# Patient Record
Sex: Male | Born: 1935 | Race: Black or African American | Hispanic: No | Marital: Married | Smoking: Current every day smoker
Health system: Southern US, Community
[De-identification: ages and names within clinical notes are randomized; demographics above are authoritative.]

## PROBLEM LIST (undated history)

## (undated) DIAGNOSIS — C801 Malignant (primary) neoplasm, unspecified: Secondary | ICD-10-CM

## (undated) HISTORY — PX: LUNG REMOVAL, PARTIAL: SHX233

## (undated) HISTORY — DX: Malignant (primary) neoplasm, unspecified: C80.1

---

## 2007-05-29 ENCOUNTER — Ambulatory Visit: Payer: Self-pay | Admitting: Gastroenterology

## 2007-11-10 ENCOUNTER — Ambulatory Visit: Payer: Self-pay | Admitting: Family Medicine

## 2008-04-04 ENCOUNTER — Ambulatory Visit: Payer: Self-pay | Admitting: Urology

## 2009-05-04 ENCOUNTER — Ambulatory Visit: Payer: Self-pay | Admitting: Family Medicine

## 2010-11-29 ENCOUNTER — Ambulatory Visit: Payer: Self-pay | Admitting: Gastroenterology

## 2012-02-05 ENCOUNTER — Ambulatory Visit: Payer: Self-pay | Admitting: Family Medicine

## 2012-02-11 ENCOUNTER — Ambulatory Visit: Payer: Self-pay | Admitting: Family Medicine

## 2012-02-11 ENCOUNTER — Ambulatory Visit: Payer: Self-pay | Admitting: Oncology

## 2012-02-11 LAB — CREATININE, SERUM
EGFR (African American): >60
EGFR (Non-African Amer.): 55 — ABNORMAL LOW

## 2012-02-12 ENCOUNTER — Ambulatory Visit: Payer: Self-pay | Admitting: Oncology

## 2012-02-12 LAB — PROTIME-INR
INR: 0.9
Prothrombin Time: 12.5 secs (ref 11.5–14.7)

## 2012-02-12 LAB — CBC CANCER CENTER
Basophil #: 0.1 x10 3/mm (ref 0.0–0.1)
Eosinophil #: 0.3 x10 3/mm (ref 0.0–0.7)
Eosinophil %: 2.9 %
HCT: 52.8 % — ABNORMAL HIGH (ref 40.0–52.0)
HGB: 17.4 g/dL (ref 13.0–18.0)
Lymphocyte #: 2.6 x10 3/mm (ref 1.0–3.6)
Lymphocyte %: 25.3 %
MCHC: 33 g/dL (ref 32.0–36.0)
MCV: 92 fL (ref 80–100)
Monocyte #: 1 x10 3/mm (ref 0.2–1.0)
Monocyte %: 9.9 %
Platelet: 259 x10 3/mm (ref 150–440)
RBC: 5.77 10*6/uL (ref 4.40–5.90)
RDW: 13.7 % (ref 11.5–14.5)
WBC: 10.2 x10 3/mm (ref 3.8–10.6)

## 2012-02-12 LAB — COMPREHENSIVE METABOLIC PANEL
Albumin: 4 g/dL (ref 3.4–5.0)
Anion Gap: 3 — ABNORMAL LOW (ref 7–16)
BUN: 9 mg/dL (ref 7–18)
Bilirubin,Total: 0.6 mg/dL (ref 0.2–1.0)
Chloride: 103 mmol/L (ref 98–107)
Co2: 30 mmol/L (ref 21–32)
Creatinine: 0.98 mg/dL (ref 0.60–1.30)
EGFR (African American): >60
Glucose: 83 mg/dL (ref 65–99)
SGOT(AST): 33 U/L (ref 15–37)
SGPT (ALT): 20 U/L (ref 12–78)
Total Protein: 7.9 g/dL (ref 6.4–8.2)

## 2012-02-12 LAB — APTT: Activated PTT: 34.3 secs (ref 23.6–35.9)

## 2012-02-16 ENCOUNTER — Ambulatory Visit: Payer: Self-pay | Admitting: Oncology

## 2012-02-18 ENCOUNTER — Ambulatory Visit: Payer: Self-pay | Admitting: Oncology

## 2012-02-19 ENCOUNTER — Ambulatory Visit: Payer: Self-pay | Admitting: Oncology

## 2012-02-25 ENCOUNTER — Ambulatory Visit: Payer: Self-pay | Admitting: Oncology

## 2012-03-18 ENCOUNTER — Ambulatory Visit: Payer: Self-pay | Admitting: Oncology

## 2012-03-19 ENCOUNTER — Other Ambulatory Visit: Payer: Self-pay | Admitting: Cardiothoracic Surgery

## 2012-03-19 ENCOUNTER — Ambulatory Visit: Payer: Self-pay

## 2012-03-19 LAB — PROTIME-INR
INR: 0.8
Prothrombin Time: 11.4 secs — ABNORMAL LOW (ref 11.5–14.7)

## 2012-03-19 LAB — COMPREHENSIVE METABOLIC PANEL
Albumin: 4 g/dL (ref 3.4–5.0)
Alkaline Phosphatase: 114 U/L (ref 50–136)
BUN: 8 mg/dL (ref 7–18)
Bilirubin,Total: 0.6 mg/dL (ref 0.2–1.0)
Creatinine: 1.14 mg/dL (ref 0.60–1.30)
Glucose: 87 mg/dL (ref 65–99)
SGOT(AST): 33 U/L (ref 15–37)
SGPT (ALT): 19 U/L (ref 12–78)

## 2012-03-19 LAB — CBC WITH DIFFERENTIAL/PLATELET
Basophil #: 0.1 10*3/uL (ref 0.0–0.1)
Eosinophil #: 0.2 10*3/uL (ref 0.0–0.7)
Eosinophil %: 2.1 %
HCT: 49.1 % (ref 40.0–52.0)
HGB: 16 g/dL (ref 13.0–18.0)
Lymphocyte #: 0.9 10*3/uL — ABNORMAL LOW (ref 1.0–3.6)
Lymphocyte %: 9.6 %
MCHC: 32.5 g/dL (ref 32.0–36.0)
Monocyte #: 0.9 x10 3/mm (ref 0.2–1.0)
Monocyte %: 9.8 %
Neutrophil #: 7.2 10*3/uL — ABNORMAL HIGH (ref 1.4–6.5)
RDW: 13.5 % (ref 11.5–14.5)
WBC: 9.3 10*3/uL (ref 3.8–10.6)

## 2012-03-19 LAB — APTT: Activated PTT: 34.3 secs (ref 23.6–35.9)

## 2012-04-23 DIAGNOSIS — G8912 Acute post-thoracotomy pain: Secondary | ICD-10-CM | POA: Insufficient documentation

## 2013-11-29 IMAGING — CT NM PET TUM IMG INITIAL (PI) SKULL BASE T - THIGH
1 of 5 series · 1 of 25 positions shown · non-contrast
Comparison: none

REASON FOR EXAM: Lung cancer initial staging
COMMENTS:

[Series 3: ct wb 3.0 b30f · axial · 3.0mm · 0.98mm/px · 1 of 435 slices shown]
[im 435/435  brain]
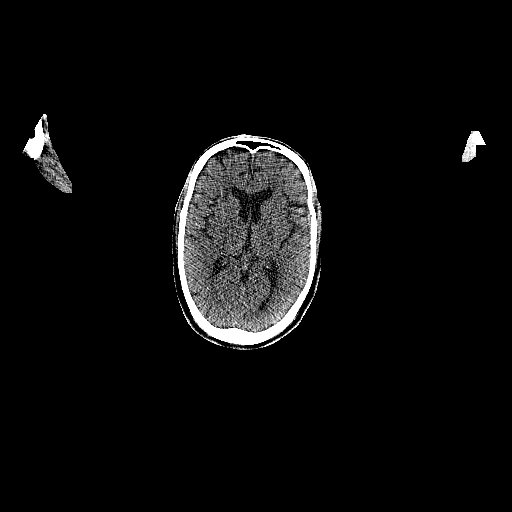

[1 of 25 positions shown; findings below may reference images not displayed]

PROCEDURE:     PET - PET/CT INIT STAGING LUNG CA  - February 25, 2012  [DATE]

RESULT:

The patient has a fasting blood glucose level measuring 78 mg/dl. The
patient received a dose of 12.7 mCi of F18-FDG in the right antecubital
region at [DATE] hours with imaging obtained from the base of the brain into
the thighs between the hours of [DATE] and [DATE]. Low dose noncontrast CT is
performed over the same region for the purposes of attenuation correction
and fusion. The noncontrast CT, attenuation corrected PET images and fused
PET/CT data sets are reconstructed by the [employer] software in the axial,
coronal and sagittal planes. A rotating 3 dimensional maximum intensity
pixel projection study is also performed.

There is no previous similar study for comparison.

There is abnormal accumulation of F18-FDG in the small, irregular density in
the posterior left upper lobe near the level of the aortic arch
demonstrating a maximum SUV of 2.07 and a mean of 1.17. The uptake in the
right lung apical region shows a maximum 4.55 SUV with a mean uptake of
in the area of most intense activity. Less intense uptake is seen inferior
to this. There does not appear to be significant mediastinal or hilar
localization. There is some increased localization about the lateral aspect
of the right hip joint likely secondary to inflammation. Genitourinary and
gastrointestinal uptake appears to be grossly normal.
IMPRESSION: 1.     Findings concerning for underlying malignant disease in the right
upper lobe and possibly in the left upper lobe. No definite abnormal
localization in the mediastinal or hilar regions.
2.     Probable degenerative uptake in the right hip

[REDACTED]

## 2015-07-27 ENCOUNTER — Ambulatory Visit (INDEPENDENT_AMBULATORY_CARE_PROVIDER_SITE_OTHER): Payer: Medicare PPO | Admitting: Family Medicine

## 2015-07-27 ENCOUNTER — Encounter: Payer: Self-pay | Admitting: Family Medicine

## 2015-07-27 VITALS — BP 120/70 | HR 84 | Ht 66.0 in | Wt 128.0 lb

## 2015-07-27 DIAGNOSIS — J0100 Acute maxillary sinusitis, unspecified: Secondary | ICD-10-CM

## 2015-07-27 DIAGNOSIS — J40 Bronchitis, not specified as acute or chronic: Secondary | ICD-10-CM

## 2015-07-27 MED ORDER — AMOXICILLIN-POT CLAVULANATE 875-125 MG PO TABS: 1.0000 | ORAL_TABLET | Freq: Two times a day (BID) | ORAL | Status: DC

## 2015-07-27 NOTE — Progress Notes (Signed)
Name: Jake Ellis   MRN: [id number]    DOB: December 09, 1935   Date:07/27/2015       Progress Note  Subjective  Chief Complaint  Chief Complaint  Patient presents with  . Sinusitis    cough and cong- no color/ thick production    Sinusitis This is a new problem. The current episode started 1 to 4 weeks ago. The problem has been waxing and waning since onset. There has been no fever. The pain is mild. Associated symptoms include congestion and coughing. Pertinent negatives include no chills, diaphoresis, ear pain, headaches, hoarse voice, neck pain, shortness of breath, sinus pressure, sneezing, sore throat or swollen glands. Past treatments include nothing. The treatment provided no relief.  Cough This is a recurrent problem. The current episode started 1 to 4 weeks ago. The problem has been waxing and waning. The cough is non-productive. Pertinent negatives include no chest pain, chills, ear congestion, ear pain, fever, headaches, heartburn, hemoptysis, myalgias, nasal congestion, postnasal drip, rash, sore throat, shortness of breath, weight loss or wheezing. He has tried nothing for the symptoms. There is no history of environmental allergies.    No problem-specific assessment & plan notes found for this encounter.   Past Medical History  Diagnosis Date  . Cancer [facility])     Past Surgical History  Procedure Laterality Date  . Lung removal, partial Right     History reviewed. No pertinent family history.  Social History   Social History  . Marital Status: Married    Spouse Name: N/A  . Number of Children: N/A  . Years of Education: N/A   Occupational History  . Not on file.   Social History Main Topics  . Smoking status: Current Every Day Smoker  . Smokeless tobacco: Not on file  . Alcohol Use: Not on file  . Drug Use: Not on file  . Sexual Activity: Not on file   Other Topics Concern  . Not on file   Social History Narrative  . No narrative on file    Allergies   Allergen Reactions  . Aspirin Other (See Comments)     Review of Systems  Constitutional: Negative for fever, chills, weight loss, malaise/fatigue and diaphoresis.  HENT: Positive for congestion. Negative for ear discharge, ear pain, hoarse voice, postnasal drip, sinus pressure, sneezing and sore throat.   Eyes: Negative for blurred vision.  Respiratory: Positive for cough. Negative for hemoptysis, sputum production, shortness of breath and wheezing.   Cardiovascular: Negative for chest pain, palpitations and leg swelling.  Gastrointestinal: Negative for heartburn, nausea, abdominal pain, diarrhea, constipation, blood in stool and melena.  Genitourinary: Negative for dysuria, urgency, frequency and hematuria.  Musculoskeletal: Negative for myalgias, back pain, joint pain and neck pain.  Skin: Negative for rash.  Neurological: Negative for dizziness, tingling, sensory change, focal weakness and headaches.  Endo/Heme/Allergies: Negative for environmental allergies and polydipsia. Does not bruise/bleed easily.  Psychiatric/Behavioral: Negative for depression and suicidal ideas. The patient is not nervous/anxious and does not have insomnia.      Objective  Filed Vitals:   07/27/15 1343  BP: 120/70  Pulse: 84  Height: '5\' 6"'$  (1.676 m)  Weight: 128 lb (58.06 kg)    Physical Exam  Constitutional: He is oriented to person, place, and time and well-developed, well-nourished, and in no distress.  HENT:  Head: Normocephalic.  Right Ear: External ear normal.  Left Ear: External ear normal.  Nose: Nose normal.  Mouth/Throat: Oropharynx is clear and moist.  Eyes:  Conjunctivae and EOM are normal. Pupils are equal, round, and reactive to light. Right eye exhibits no discharge. Left eye exhibits no discharge. No scleral icterus.  Neck: Normal range of motion. Neck supple. No JVD present. No tracheal deviation present. No thyromegaly present.  Cardiovascular: Normal rate, regular rhythm,  normal heart sounds and intact distal pulses.  Exam reveals no gallop and no friction rub.   No murmur heard. Pulmonary/Chest: Breath sounds normal. No respiratory distress. He has no wheezes. He has no rales.  Abdominal: Soft. Bowel sounds are normal. He exhibits no mass. There is no hepatosplenomegaly. There is no tenderness. There is no rebound, no guarding and no CVA tenderness.  Musculoskeletal: Normal range of motion. He exhibits no edema or tenderness.  Lymphadenopathy:    He has no cervical adenopathy.  Neurological: He is alert and oriented to person, place, and time. He has normal sensation, normal strength and intact cranial nerves. No cranial nerve deficit.  Skin: Skin is warm. No rash noted.  Psychiatric: Mood and affect normal.  Nursing note and vitals reviewed.     Assessment & Plan  Problem List Items Addressed This Visit    None    Visit Diagnoses    Acute maxillary sinusitis, recurrence not specified    -  Primary    Relevant Medications    amoxicillin-clavulanate (AUGMENTIN) 875-125 MG tablet    Bronchitis        Relevant Medications    amoxicillin-clavulanate (AUGMENTIN) 875-125 MG tablet         Dr. [facility]  07/27/2015

## 2016-06-12 DIAGNOSIS — C7951 Secondary malignant neoplasm of bone: Secondary | ICD-10-CM | POA: Insufficient documentation

## 2016-07-04 ENCOUNTER — Other Ambulatory Visit: Payer: Self-pay

## 2016-07-25 ENCOUNTER — Ambulatory Visit (INDEPENDENT_AMBULATORY_CARE_PROVIDER_SITE_OTHER): Payer: Medicare PPO | Admitting: Family Medicine

## 2016-07-25 ENCOUNTER — Encounter: Payer: Self-pay | Admitting: Family Medicine

## 2016-07-25 VITALS — BP 128/82 | HR 88 | Temp 98.0°F | Resp 16 | Ht 66.0 in | Wt 112.0 lb

## 2016-07-25 DIAGNOSIS — C787 Secondary malignant neoplasm of liver and intrahepatic bile duct: Secondary | ICD-10-CM

## 2016-07-25 DIAGNOSIS — C3490 Malignant neoplasm of unspecified part of unspecified bronchus or lung: Secondary | ICD-10-CM | POA: Diagnosis not present

## 2016-07-25 DIAGNOSIS — R160 Hepatomegaly, not elsewhere classified: Secondary | ICD-10-CM

## 2016-07-25 DIAGNOSIS — R079 Chest pain, unspecified: Secondary | ICD-10-CM

## 2016-07-25 NOTE — Progress Notes (Signed)
Name: Jake Ellis   MRN: [id number]    DOB: [birth year]   Date:07/26/2016       Progress Note  Subjective  Chief Complaint  Chief Complaint  Patient presents with  . Bloated    Right side is swollen in abdomal area where cancer is. Following [place]     Abdominal Pain  This is a new problem. The current episode started in the past 7 days. The onset quality is sudden. The problem occurs constantly. The problem has been gradually worsening. The pain is located in the RUQ. Quality: "puffiness" Associated symptoms include nausea. Pertinent negatives include no constipation, diarrhea, dysuria, fever, frequency, headaches, hematuria, melena, myalgias or weight loss. The treatment provided mild relief. There is no history of abdominal surgery, colon cancer, Crohn's disease, gallstones, GERD, irritable bowel syndrome, pancreatitis, PUD or ulcerative colitis.  Chest Pain   This is a recurrent problem. The current episode started more than 1 month ago. The problem occurs daily. The pain is at a severity of 3/10. Quality: "sore" The pain does not radiate. Associated symptoms include abdominal pain, malaise/fatigue and nausea. Pertinent negatives include no back pain, cough, dizziness, fever, headaches, irregular heartbeat, palpitations, PND, shortness of breath or sputum production. The treatment provided mild relief.    No problem-specific Assessment & Plan notes found for this encounter.   Past Medical History:  Diagnosis Date  . Cancer [facility])     Past Surgical History:  Procedure Laterality Date  . LUNG REMOVAL, PARTIAL Right     Family History  Problem Relation Age of Onset  . Family history unknown: Yes    Social History   Social History  . Marital status: Married    Spouse name: N/A  . Number of children: N/A  . Years of education: N/A   Occupational History  . Not on file.   Social History Main Topics  . Smoking status: Current Every Day Smoker    Packs/day:  0.50  . Smokeless tobacco: Never Used  . Alcohol use 1.2 oz/week    1 Cans of beer, 1 Shots of liquor per week  . Drug use: No  . Sexual activity: Not on file   Other Topics Concern  . Not on file   Social History Narrative  . No narrative on file    Allergies  Allergen Reactions  . Aspirin Other (See Comments)    Outpatient Medications Prior to Visit  Medication Sig Dispense Refill  . amoxicillin-clavulanate (AUGMENTIN) 875-125 MG tablet Take 1 tablet by mouth 2 (two) times daily. 20 tablet 0   No facility-administered medications prior to visit.     Review of Systems  Constitutional: Positive for malaise/fatigue. Negative for chills, fever and weight loss.  HENT: Negative for ear discharge, ear pain and sore throat.   Eyes: Negative for blurred vision.  Respiratory: Negative for cough, sputum production, shortness of breath and wheezing.   Cardiovascular: Positive for chest pain. Negative for palpitations, leg swelling and PND.  Gastrointestinal: Positive for abdominal pain and nausea. Negative for blood in stool, constipation, diarrhea, heartburn and melena.  Genitourinary: Negative for dysuria, frequency, hematuria and urgency.  Musculoskeletal: Negative for back pain, joint pain, myalgias and neck pain.  Skin: Negative for rash.  Neurological: Negative for dizziness, tingling, sensory change, focal weakness and headaches.  Endo/Heme/Allergies: Negative for environmental allergies and polydipsia. Does not bruise/bleed easily.  Psychiatric/Behavioral: Negative for depression and suicidal ideas. The patient is not nervous/anxious and does not have insomnia.  Objective  Vitals:   07/25/16 1422  BP: 128/82  Pulse: 88  Resp: 16  Temp: 98 F (36.7 C)  SpO2: 98%  Weight: 112 lb (50.8 kg)  Height: '5\' 6"'$  (1.676 m)    Physical Exam  Constitutional: He is oriented to person, place, and time and well-developed, well-nourished, and in no distress.  HENT:  Head:  Normocephalic.  Right Ear: External ear normal.  Left Ear: External ear normal.  Nose: Nose normal.  Mouth/Throat: Oropharynx is clear and moist.  Eyes: Conjunctivae and EOM are normal. Pupils are equal, round, and reactive to light. Right eye exhibits no discharge. Left eye exhibits no discharge. No scleral icterus.  Neck: Normal range of motion. Neck supple. No JVD present. No tracheal deviation present. No thyromegaly present.  Cardiovascular: Normal rate, regular rhythm, normal heart sounds and intact distal pulses.  Exam reveals no gallop and no friction rub.   No murmur heard. Pulmonary/Chest: Breath sounds normal. No respiratory distress. He has no wheezes. He has no rales.  Abdominal: Soft. Bowel sounds are normal. He exhibits mass. There is hepatosplenomegaly and hepatomegaly. There is no splenomegaly. There is no tenderness. There is no rebound, no guarding and no CVA tenderness.    Musculoskeletal: Normal range of motion. He exhibits no edema or tenderness.  Lymphadenopathy:    He has no cervical adenopathy.  Neurological: He is alert and oriented to person, place, and time. He has normal sensation, normal strength, normal reflexes and intact cranial nerves. No cranial nerve deficit.  Skin: Skin is warm. No rash noted.  Psychiatric: Mood and affect normal.  Nursing note and vitals reviewed.     Assessment & Plan  Problem List Items Addressed This Visit    None    Visit Diagnoses    Hepatomegaly    -  Primary   Discussed with [facility] oncology triage to advise prior to upcoming appt   Chest pain, unspecified type       Relevant Orders   EKG 12-Lead (Completed)   Malignant neoplasm of lung, unspecified laterality, unspecified part of lung ([place])       Hepatic metastases ([place])          No orders of the defined types were placed in this encounter.     Dr. [facility]  07/26/16

## 2016-08-15 ENCOUNTER — Other Ambulatory Visit: Payer: Self-pay

## 2016-09-23 ENCOUNTER — Other Ambulatory Visit: Payer: Self-pay

## 2016-10-07 ENCOUNTER — Other Ambulatory Visit: Payer: Self-pay

## 2016-10-16 DEATH — deceased
